# Patient Record
Sex: Female | Born: 1980 | Race: Black or African American | Hispanic: No | Marital: Married | Smoking: Never smoker
Health system: Southern US, Community
[De-identification: ages and names within clinical notes are randomized; demographics above are authoritative.]

## PROBLEM LIST (undated history)

## (undated) DIAGNOSIS — J45909 Unspecified asthma, uncomplicated: Secondary | ICD-10-CM

## (undated) HISTORY — PX: CHOLECYSTECTOMY: SHX55

## (undated) HISTORY — PX: BREAST SURGERY: SHX581

---

## 2016-08-11 ENCOUNTER — Encounter (HOSPITAL_BASED_OUTPATIENT_CLINIC_OR_DEPARTMENT_OTHER): Payer: Self-pay | Admitting: *Deleted

## 2016-08-11 ENCOUNTER — Emergency Department (HOSPITAL_BASED_OUTPATIENT_CLINIC_OR_DEPARTMENT_OTHER): Payer: Self-pay

## 2016-08-11 ENCOUNTER — Emergency Department (HOSPITAL_BASED_OUTPATIENT_CLINIC_OR_DEPARTMENT_OTHER)
Admission: EM | Admit: 2016-08-11 | Discharge: 2016-08-11 | Disposition: A | Discharge status: Home / Self Care | Payer: Self-pay | Attending: Emergency Medicine | Admitting: Emergency Medicine

## 2016-08-11 DIAGNOSIS — J45909 Unspecified asthma, uncomplicated: Secondary | ICD-10-CM | POA: Insufficient documentation

## 2016-08-11 DIAGNOSIS — J069 Acute upper respiratory infection, unspecified: Secondary | ICD-10-CM | POA: Insufficient documentation

## 2016-08-11 DIAGNOSIS — J988 Other specified respiratory disorders: Secondary | ICD-10-CM

## 2016-08-11 HISTORY — DX: Unspecified asthma, uncomplicated: J45.909

## 2016-08-11 MED ORDER — BENZONATATE 100 MG PO CAPS: 200.0000 mg | ORAL_CAPSULE | Freq: Two times a day (BID) | ORAL | 0 refills | Status: AC | PRN

## 2016-08-11 MED ORDER — ALBUTEROL SULFATE HFA 108 (90 BASE) MCG/ACT IN AERS
1.0000 | INHALATION_SPRAY | Freq: Once | RESPIRATORY_TRACT | Status: AC
  Administered 2016-08-11: 2 via RESPIRATORY_TRACT
  Filled 2016-08-11: qty 6.7

## 2016-08-11 MED ORDER — DOXYCYCLINE HYCLATE 100 MG PO CAPS: 100.0000 mg | ORAL_CAPSULE | Freq: Two times a day (BID) | ORAL | 0 refills | Status: AC

## 2016-08-11 MED ORDER — OXYMETAZOLINE HCL 0.05 % NA SOLN: 1.0000 | Freq: Two times a day (BID) | NASAL | 0 refills | Status: AC

## 2016-08-11 NOTE — ED Provider Notes (Signed)
[facility] Provider Note   CSN: [id number] Arrival date & time: 08/11/16  1955   By signing my name below, I, Soijett Blue, attest that this documentation has been prepared under the direction and in the presence of Melburn HakeNicole Trenity Pha, PA-C Electronically Signed: Soijett Blue, ED Scribe. 08/11/16. 8:54 PM.  History   Chief Complaint Chief Complaint  Patient presents with  . Fever  . Cough    HPI Andrea Hill is a 36 y.o. female with a PMHx of asthma, who presents to the Emergency Department complaining of subjective fever onset 2 weeks worsening 3 days ago. Pt reports associated productive cough x 2 weeks, chest wall pain due to cough, nasal congestion, generalized body aches, chest tightness, wheezing. Pt has tried albuterol inhaler, robitussin, and allergy medications, with no relief of her symptoms. She notes that she is out of her albuterol inhaler. Denies smoking cigarettes, sick contacts, or recent abx use. She denies sinus pain, sinus pressure, sore throat, rhinorrhea, HA, neck stiffness, SOB, abdominal pain, vomiting, diarrhea, rash, and any other symptoms.    The history is provided by the patient. No language interpreter was used.    Past Medical History:  Diagnosis Date  . Asthma     There are no active problems to display for this patient.   Past Surgical History:  Procedure Laterality Date  . BREAST SURGERY    . CHOLECYSTECTOMY      OB History    No data available       Home Medications    Prior to Admission medications   Medication Sig Start Date End Date Taking? Authorizing Provider  benzonatate (TESSALON) 100 MG capsule Take 2 capsules (200 mg total) by mouth 2 (two) times daily as needed for cough. 08/11/16   Barrett HenleNadeau, Rykin Route Elizabeth, PA-C  doxycycline (VIBRAMYCIN) 100 MG capsule Take 1 capsule (100 mg total) by mouth 2 (two) times daily. 08/11/16   Barrett HenleNadeau, Johnathon Mittal Elizabeth, PA-C  oxymetazoline (AFRIN NASAL SPRAY) 0.05 % nasal spray Place 1  spray into both nostrils 2 (two) times daily. Spray once into each nostril twice daily for up to the next 3 days. Do not use for more than 3 days to prevent rebound rhinorrhea. 08/11/16   Barrett HenleNadeau, Andrea Ferrer Elizabeth, PA-C    Family History No family history on file.  Social History Social History  Substance Use Topics  . Smoking status: Never Smoker  . Smokeless tobacco: Never Used  . Alcohol use No     Allergies   Patient has no known allergies.   Review of Systems Review of Systems  Constitutional: Positive for fever (subjective).  HENT: Negative for congestion, rhinorrhea, sinus pain, sinus pressure and sore throat.   Respiratory: Positive for cough (productive), chest tightness and wheezing. Negative for shortness of breath.        +chest wall pain due to cough  Gastrointestinal: Negative for abdominal pain, diarrhea, nausea and vomiting.  Musculoskeletal: Positive for myalgias. Negative for neck stiffness.  Skin: Negative for rash.  Neurological: Negative for headaches.  All other systems reviewed and are negative.    Physical Exam Updated Vital Signs BP (!) 126/95 (BP Location: Left Arm)   Pulse 85   Temp 98.1 F (36.7 C) (Oral)   Resp 16   Ht 5' (1.524 m)   Wt 150 lb (68 kg)   LMP 08/07/2016   SpO2 98%   BMI 29.29 kg/m   Physical Exam  Constitutional: She is oriented to person, place, and time. She  appears well-developed and well-nourished. No distress.  HENT:  Head: Normocephalic and atraumatic.  Right Ear: Tympanic membrane normal.  Left Ear: Tympanic membrane normal.  Nose: Mucosal edema present. Right sinus exhibits maxillary sinus tenderness. Right sinus exhibits no frontal sinus tenderness. Left sinus exhibits maxillary sinus tenderness. Left sinus exhibits no frontal sinus tenderness.  Mouth/Throat: Uvula is midline, oropharynx is clear and moist and mucous membranes are normal. No oropharyngeal exudate, posterior oropharyngeal edema, posterior  oropharyngeal erythema or tonsillar abscesses. No tonsillar exudate.  Eyes: Conjunctivae and EOM are normal. Right eye exhibits no discharge. Left eye exhibits no discharge. No scleral icterus.  Neck: Normal range of motion. Neck supple.  Cardiovascular: Normal rate, regular rhythm, normal heart sounds and intact distal pulses.   Pulmonary/Chest: Effort normal and breath sounds normal. No respiratory distress. She has no wheezes. She has no rales. She exhibits no tenderness.  Abdominal: Soft. There is no tenderness.  Musculoskeletal: Normal range of motion. She exhibits no edema.  Lymphadenopathy:    She has no cervical adenopathy.  Neurological: She is alert and oriented to person, place, and time.  Skin: Skin is warm and dry. She is not diaphoretic.  Nursing note and vitals reviewed.    ED Treatments / Results  DIAGNOSTIC STUDIES: Oxygen Saturation is 98% on RA, nl by my interpretation.    COORDINATION OF CARE: 8:52 PM Discussed treatment plan with pt at bedside which includes abx RX, CXR, and pt agreed to plan.   Labs (all labs ordered are listed, but only abnormal results are displayed) Labs Reviewed - No data to display  EKG  EKG Interpretation  Date/Time:  Friday August 11 2016 20:42:22 EDT Ventricular Rate:  89 PR Interval:    QRS Duration: 76 QT Interval:  352 QTC Calculation: 429 R Axis:   77 Text Interpretation:  Sinus rhythm Normal ECG Confirmed by Geoffery Lyons ([id number]) on 08/11/2016 8:51:40 PM       Radiology Dg Chest 2 View  Result Date: 08/11/2016 CLINICAL DATA:  Cough with chest pain for 2 weeks EXAM: CHEST  2 VIEW COMPARISON:  None. FINDINGS: Small focal opacity in the left upper lobe. No pleural effusion. Normal heart size. No pneumothorax. Surgical clips in the right upper quadrant. IMPRESSION: Small opacity in the left upper lung could reflect a small focus of atelectasis or inflammatory infiltrate. Electronically Signed   By: Jasmine Pang M.D.   On:  08/11/2016 21:08    Procedures Procedures (including critical care time)  Medications Ordered in ED Medications  albuterol (PROVENTIL HFA;VENTOLIN HFA) 108 (90 Base) MCG/ACT inhaler 1-2 puff (2 puffs Inhalation Given 08/11/16 2120)     Initial Impression / Assessment and Plan / ED Course  I have reviewed the triage vital signs and the nursing notes.  Pertinent labs & imaging results that were available during my care of the patient were reviewed by me and considered in my medical decision making (see chart for details).     Patient presents with gradually worsening productive cough with associated subjective fever, nasal congestion and wheezing. No relief with allergy medication or Robitussin taken at home. Denies any known sick contacts. Denies fever, chest pain or shortness of breath. VSS. Exam revealed mild mucosal edema and mild maxillary sinus tenderness bilaterally. Lungs clear to auscultation bilaterally. Remaining exam unremarkable. Patient given albuterol inhaler in the ED as she reports she has recently run out. Chest x-ray showed small opacity and left upper lobe.  Final Clinical Impressions(s) / ED Diagnoses  Final diagnoses:  Respiratory infection    New Prescriptions New Prescriptions   BENZONATATE (TESSALON) 100 MG CAPSULE    Take 2 capsules (200 mg total) by mouth 2 (two) times daily as needed for cough.   DOXYCYCLINE (VIBRAMYCIN) 100 MG CAPSULE    Take 1 capsule (100 mg total) by mouth 2 (two) times daily.   OXYMETAZOLINE (AFRIN NASAL SPRAY) 0.05 % NASAL SPRAY    Place 1 spray into both nostrils 2 (two) times daily. Spray once into each nostril twice daily for up to the next 3 days. Do not use for more than 3 days to prevent rebound rhinorrhea.   I personally performed the services described in this documentation, which was scribed in my presence. The recorded information has been reviewed and is accurate.     Barrett Henle, PA-C 08/11/16 2121      Geoffery Lyons, MD 08/11/16 2127

## 2016-08-11 NOTE — Discharge Instructions (Signed)
Take your antibiotics as prescribed. Take your medications as prescribed as needed for ear cough and nasal congestion. Continue using her albuterol inhaler as prescribed as needed for shortness of breath/wheezing. Please follow up with a primary care provider from the Resource Guide provided below in 3-4 days if symptoms have not improved. Please return to the Emergency Department if symptoms worsen or new onset of fever, neck stiffness, difficulty breathing, coughing up blood, shortness of breath, chest pain, vomiting.

## 2016-08-11 NOTE — ED Triage Notes (Signed)
Fever and cough x 3 weeks.

## 2016-08-11 NOTE — ED Notes (Signed)
Pt has taken robitussin and albuterol today but feels like she had no relief.

## 2018-08-31 IMAGING — CR DG CHEST 2V
2 series · 2 of 2 positions shown · non-contrast
Comparison: None.

CLINICAL DATA: Cough with chest pain for 2 weeks

EXAM:
CHEST  2 VIEW

[w chest pa]
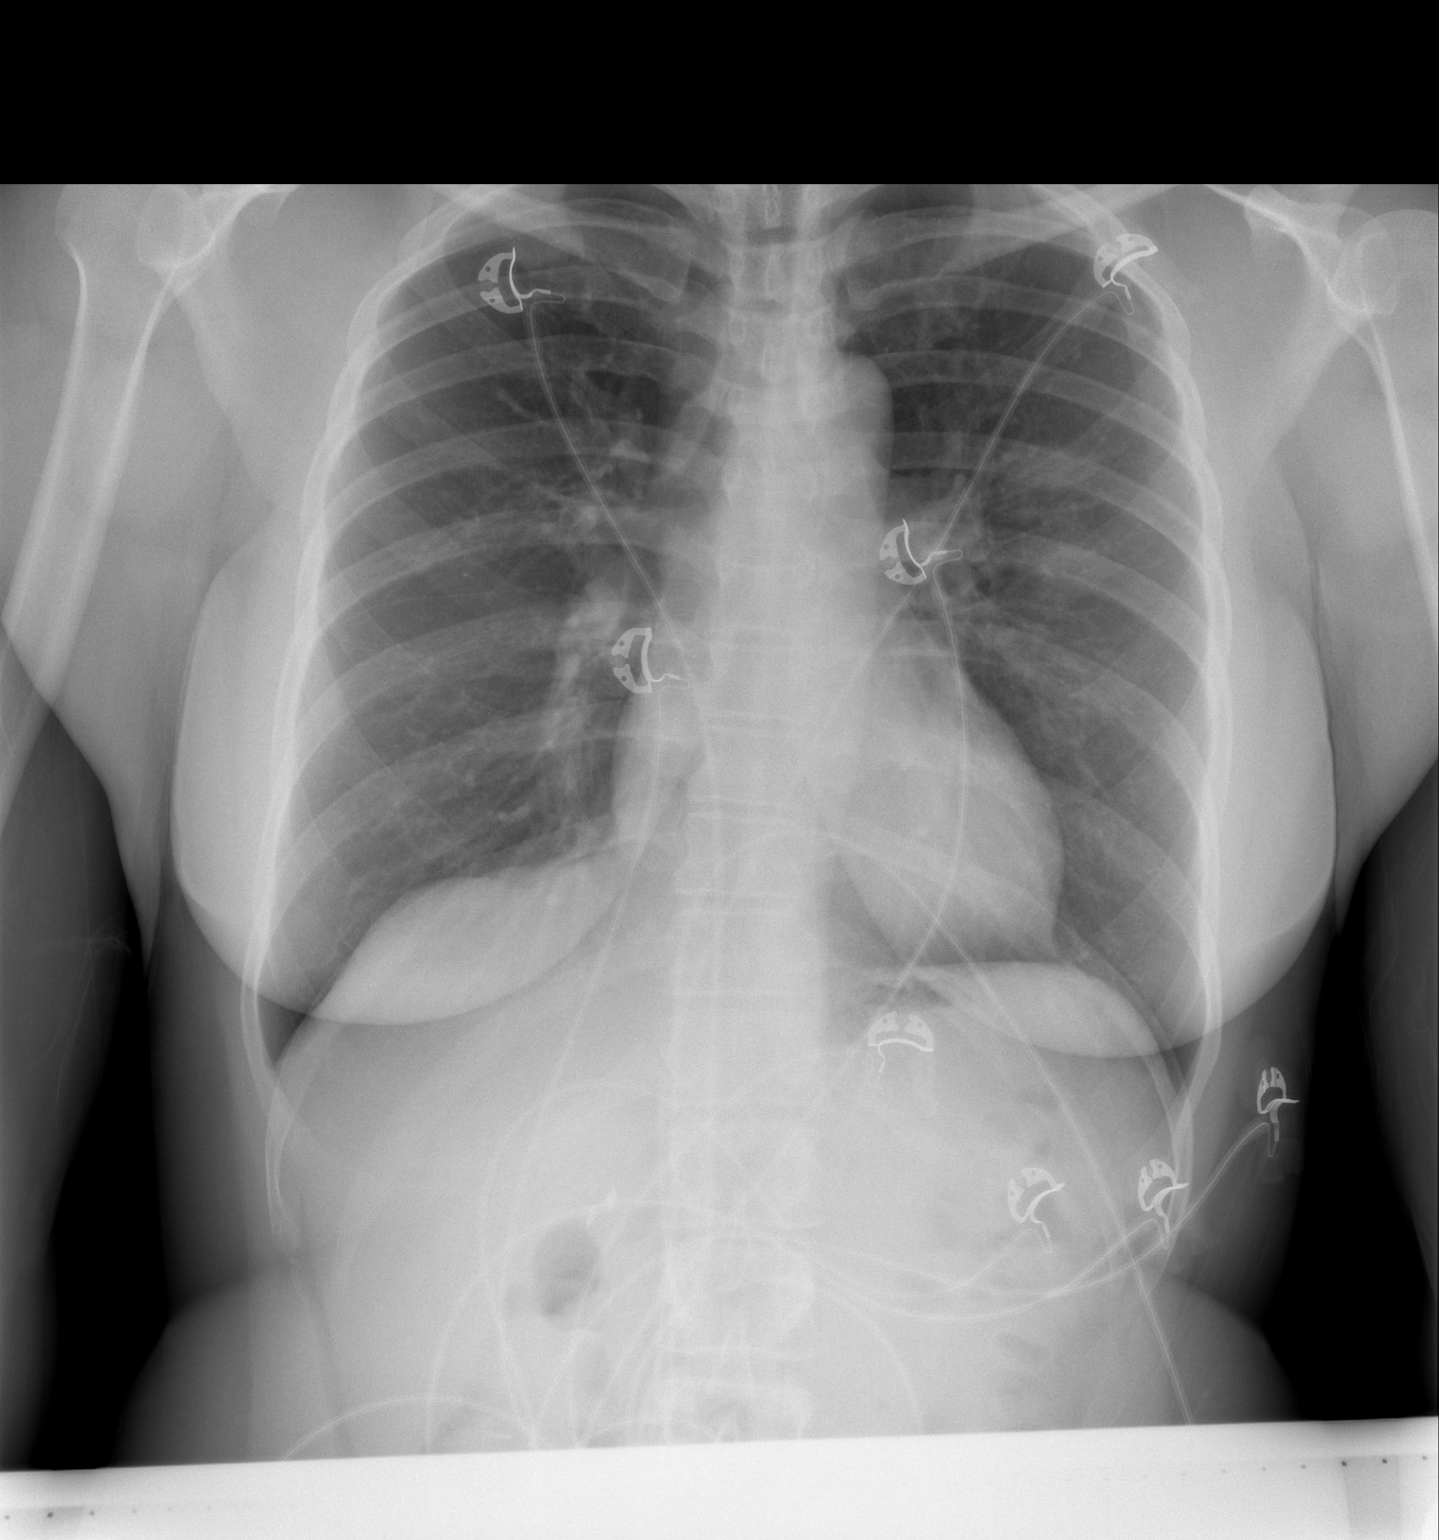

[w chest lat]
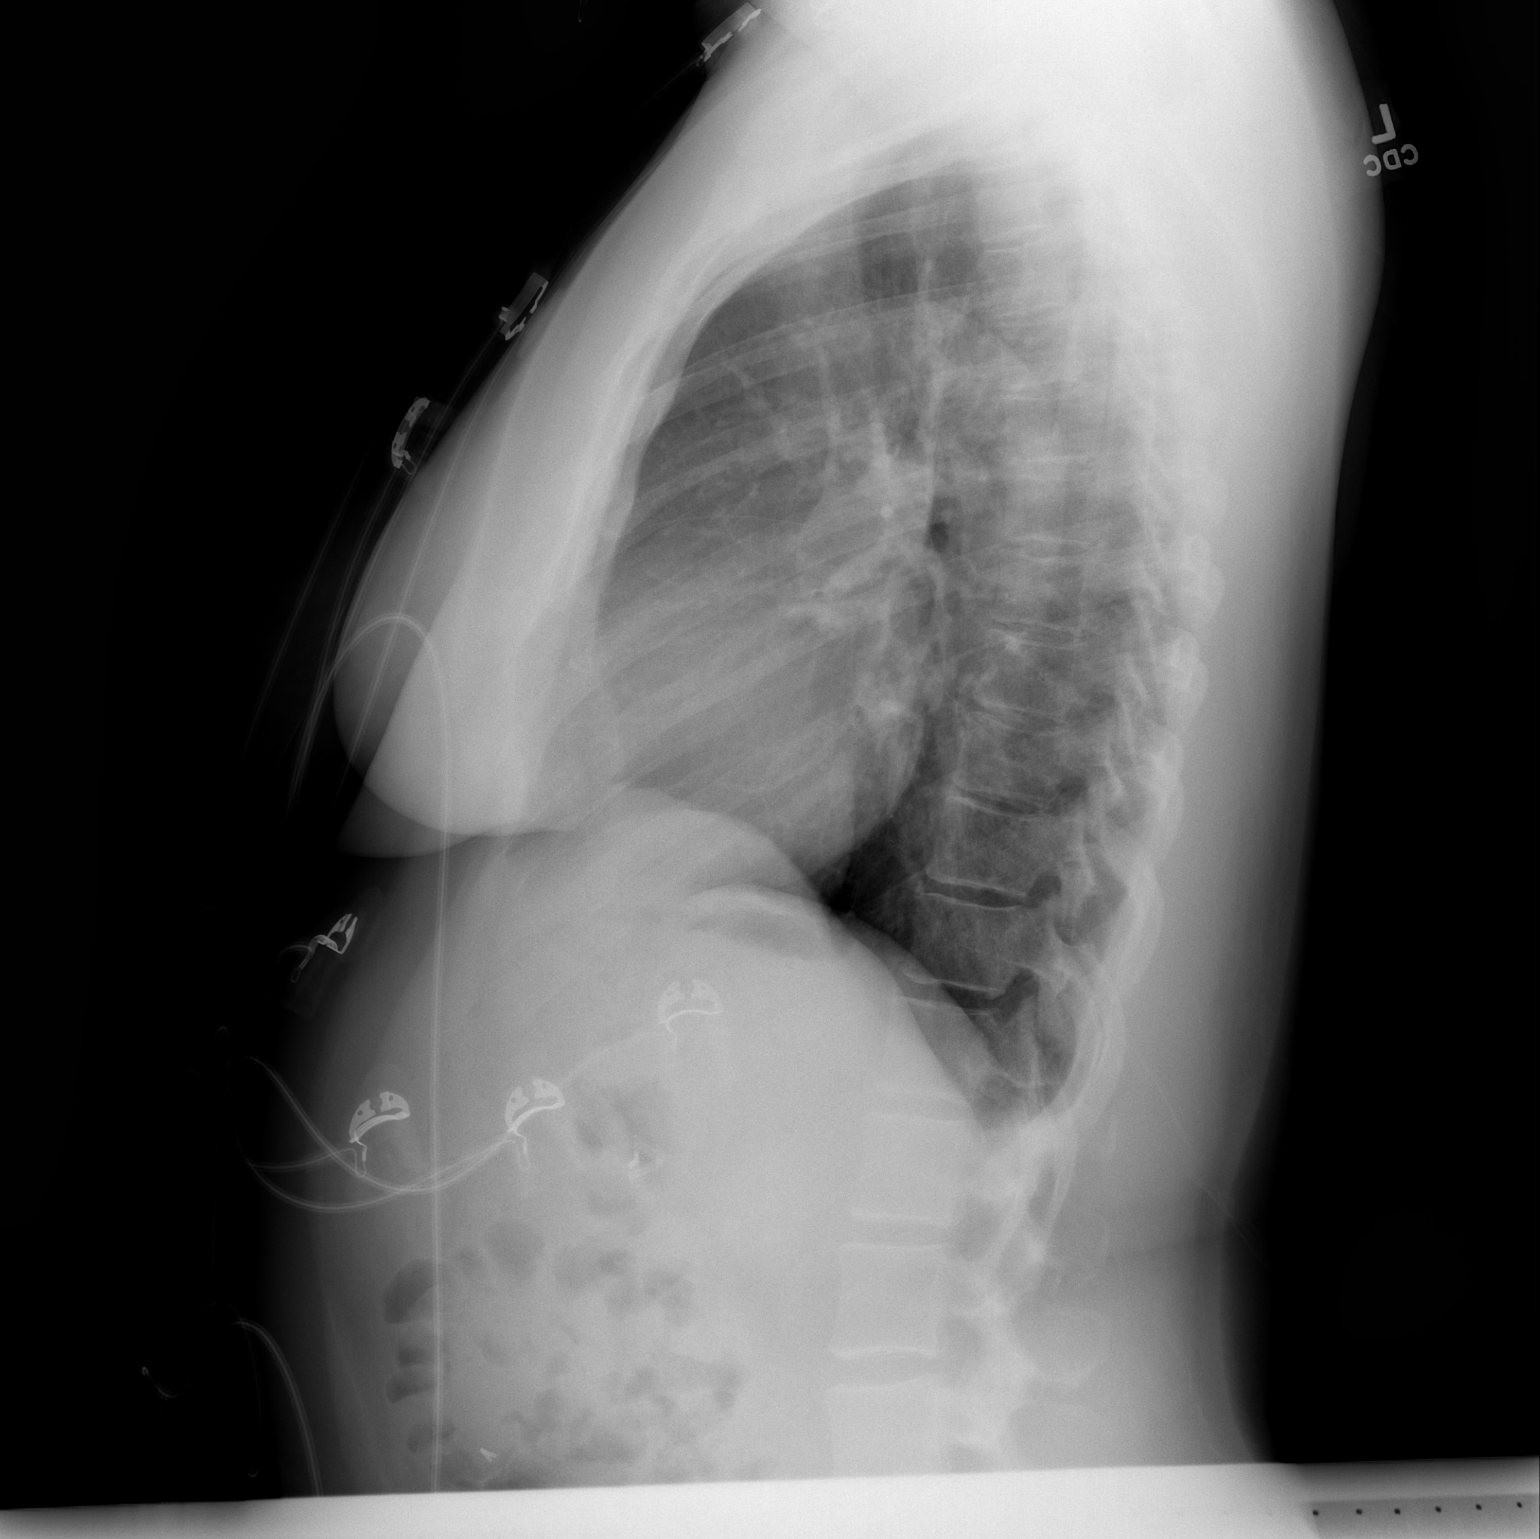

[2 of 2 positions shown; findings below may reference images not displayed]

FINDINGS: Small focal opacity in the left upper lobe. No pleural effusion.
Normal heart size. No pneumothorax. Surgical clips in the right
upper quadrant.
IMPRESSION: Small opacity in the left upper lung could reflect a small focus of
atelectasis or inflammatory infiltrate.

## 2019-01-30 ENCOUNTER — Encounter (HOSPITAL_BASED_OUTPATIENT_CLINIC_OR_DEPARTMENT_OTHER): Payer: Self-pay | Admitting: Emergency Medicine

## 2019-01-30 ENCOUNTER — Other Ambulatory Visit: Payer: Self-pay

## 2019-01-30 ENCOUNTER — Emergency Department (HOSPITAL_BASED_OUTPATIENT_CLINIC_OR_DEPARTMENT_OTHER)
Admission: EM | Admit: 2019-01-30 | Discharge: 2019-01-30 | Disposition: A | Discharge status: Home / Self Care | Payer: Self-pay | Attending: Emergency Medicine | Admitting: Emergency Medicine

## 2019-01-30 DIAGNOSIS — J45909 Unspecified asthma, uncomplicated: Secondary | ICD-10-CM | POA: Insufficient documentation

## 2019-01-30 DIAGNOSIS — L299 Pruritus, unspecified: Secondary | ICD-10-CM | POA: Insufficient documentation

## 2019-01-30 DIAGNOSIS — R21 Rash and other nonspecific skin eruption: Secondary | ICD-10-CM | POA: Insufficient documentation

## 2019-01-30 MED ORDER — DIPHENHYDRAMINE HCL 25 MG PO CAPS
50.0000 mg | ORAL_CAPSULE | Freq: Once | ORAL | Status: AC
  Administered 2019-01-30: 10:00:00 50 mg via ORAL
  Filled 2019-01-30: qty 2

## 2019-01-30 MED ORDER — PREDNISONE 20 MG PO TABS
20.0000 mg | ORAL_TABLET | Freq: Once | ORAL | Status: AC
  Administered 2019-01-30: 10:00:00 20 mg via ORAL
  Filled 2019-01-30: qty 1

## 2019-01-30 MED ORDER — PREDNISONE 20 MG PO TABS: 20.0000 mg | ORAL_TABLET | Freq: Every day | ORAL | 0 refills | Status: AC

## 2019-01-30 MED FILL — predniSONE 20 MG TABS: 20 | 3 days supply | Qty: 3 | Fill #0

## 2019-01-30 NOTE — ED Provider Notes (Signed)
[facility] EMERGENCY DEPARTMENT Provider Note   CSN: [id number] Arrival date & time: 01/30/19  7829     History   Chief Complaint Chief Complaint  Patient presents with  . Rash    HPI Andrea Hill is a 38 y.o. female.     The history is provided by the patient.  Rash Location:  Full body Quality: itchiness and redness   Severity:  Mild Onset quality:  Gradual Timing:  Constant Progression:  Unchanged Chronicity:  New Context comment:  Possibly from a dusty box Relieved by:  Antihistamines Associated symptoms: no abdominal pain, no diarrhea, no fever, no joint pain, no nausea, no shortness of breath, no sore throat and not vomiting     Past Medical History:  Diagnosis Date  . Asthma     There are no active problems to display for this patient.   Past Surgical History:  Procedure Laterality Date  . BREAST SURGERY    . CHOLECYSTECTOMY       OB History   No obstetric history on file.      Home Medications    Prior to Admission medications   Medication Sig Start Date End Date Taking? Authorizing Provider  benzonatate (TESSALON) 100 MG capsule Take 2 capsules (200 mg total) by mouth 2 (two) times daily as needed for cough. 08/11/16   Barrett Henle, PA-C  doxycycline (VIBRAMYCIN) 100 MG capsule Take 1 capsule (100 mg total) by mouth 2 (two) times daily. 08/11/16   Barrett Henle, PA-C  oxymetazoline (AFRIN NASAL SPRAY) 0.05 % nasal spray Place 1 spray into both nostrils 2 (two) times daily. Spray once into each nostril twice daily for up to the next 3 days. Do not use for more than 3 days to prevent rebound rhinorrhea. 08/11/16   Barrett Henle, PA-C  predniSONE (DELTASONE) 20 MG tablet Take 1 tablet (20 mg total) by mouth daily for 3 days. 01/31/19 02/03/19  Virgina Norfolk, DO    Family History No family history on file.  Social History Social History   Tobacco Use  . Smoking status: Never Smoker  . Smokeless  tobacco: Never Used  Substance Use Topics  . Alcohol use: No  . Drug use: No     Allergies   Patient has no known allergies.   Review of Systems Review of Systems  Constitutional: Negative for chills and fever.  HENT: Negative for ear pain and sore throat.   Eyes: Negative for pain and visual disturbance.  Respiratory: Negative for cough and shortness of breath.   Cardiovascular: Negative for chest pain and palpitations.  Gastrointestinal: Negative for abdominal pain, diarrhea, nausea and vomiting.  Genitourinary: Negative for dysuria and hematuria.  Musculoskeletal: Negative for arthralgias and back pain.  Skin: Positive for rash. Negative for color change.  Neurological: Negative for seizures and syncope.  All other systems reviewed and are negative.    Physical Exam Updated Vital Signs BP (!) 146/95   Pulse 83   Temp 97.9 F (36.6 C) (Oral)   Resp 18   Ht 5' (1.524 m)   Wt 68 kg   LMP 01/28/2019   SpO2 99%   BMI 29.29 kg/m   Physical Exam Vitals signs and nursing note reviewed.  Constitutional:      General: She is not in acute distress.    Appearance: She is well-developed.  HENT:     Head: Normocephalic and atraumatic.     Mouth/Throat:     Mouth: Mucous membranes are  moist.     Pharynx: No posterior oropharyngeal erythema.  Eyes:     Conjunctiva/sclera: Conjunctivae normal.  Neck:     Musculoskeletal: Neck supple.  Cardiovascular:     Rate and Rhythm: Normal rate and regular rhythm.     Heart sounds: No murmur.  Pulmonary:     Effort: Pulmonary effort is normal. No respiratory distress.     Breath sounds: Normal breath sounds.  Abdominal:     Palpations: Abdomen is soft.     Tenderness: There is no abdominal tenderness.  Skin:    General: Skin is warm and dry.     Findings: Rash (hives throughout arms, legs, chest) present.  Neurological:     Mental Status: She is alert.      ED Treatments / Results  Labs (all labs ordered are listed,  but only abnormal results are displayed) Labs Reviewed - No data to display  EKG None  Radiology No results found.  Procedures Procedures (including critical care time)  Medications Ordered in ED Medications  predniSONE (DELTASONE) tablet 20 mg (has no administration in time range)  diphenhydrAMINE (BENADRYL) capsule 50 mg (has no administration in time range)     Initial Impression / Assessment and Plan / ED Course  I have reviewed the triage vital signs and the nursing notes.  Pertinent labs & imaging results that were available during my care of the patient were reviewed by me and considered in my medical decision making (see chart for details).        Andrea Hill is a 38 year old female with no significant medical history who presents to the ED with rash.  Patient with normal vitals.  No fever.  Patient with hives on her chest, arms, legs.  Started yesterday after carrying a dusty box.  Has gotten better with some Benadryl.  Does not have any GI symptoms.  No respiratory symptoms.  No mouth involvement.  No concern for anaphylaxis.  Likely contact allergy.  Will prescribe Benadryl, prednisone.  Patient denies being pregnant.  Recommend continued use of Benadryl and prednisone.  Given return precautions and discharged from ED in good condition.  This chart was dictated using voice recognition software.  Despite best efforts to proofread,  errors can occur which can change the documentation meaning.    Final Clinical Impressions(s) / ED Diagnoses   Final diagnoses:  Rash    ED Discharge Orders         Ordered    predniSONE (DELTASONE) 20 MG tablet  Daily     01/30/19 0945           Lennice Sites, DO 01/30/19 [phone]

## 2019-01-30 NOTE — ED Triage Notes (Signed)
Pt reports skin rash , neck, abdomen and hands, no known allergen, but reports carried "cardboard box' prior to rash. Pt is itching during triage. Last time took benadryl 3 Am today.

## 2019-01-30 NOTE — Discharge Instructions (Signed)
Take Benadryl and continue steroids.  Return to the ED if symptoms worsen.  Follow-up with your primary care doctor.
